# Patient Record
Sex: Female | Born: 1955 | Race: White | Hispanic: No | Marital: Single | Smoking: Never smoker
Health system: Southern US, Community
[De-identification: ages and names within clinical notes are randomized; demographics above are authoritative.]

## PROBLEM LIST (undated history)

## (undated) DIAGNOSIS — E079 Disorder of thyroid, unspecified: Secondary | ICD-10-CM

---

## 2017-02-07 ENCOUNTER — Emergency Department (HOSPITAL_BASED_OUTPATIENT_CLINIC_OR_DEPARTMENT_OTHER)

## 2017-02-07 ENCOUNTER — Emergency Department (HOSPITAL_BASED_OUTPATIENT_CLINIC_OR_DEPARTMENT_OTHER)
Admission: EM | Admit: 2017-02-07 | Discharge: 2017-02-07 | Disposition: A | Discharge status: Home / Self Care | Attending: Emergency Medicine | Admitting: Emergency Medicine

## 2017-02-07 ENCOUNTER — Encounter (HOSPITAL_BASED_OUTPATIENT_CLINIC_OR_DEPARTMENT_OTHER): Payer: Self-pay

## 2017-02-07 DIAGNOSIS — M25511 Pain in right shoulder: Secondary | ICD-10-CM

## 2017-02-07 HISTORY — DX: Disorder of thyroid, unspecified: E07.9

## 2017-02-07 NOTE — ED Triage Notes (Signed)
Pt fell while at work. C/o right shoulder pain. Pt in sling upon triage.

## 2017-02-07 NOTE — Discharge Instructions (Signed)
It was a pleasure to take care of you today.   No fracture or dislocation of right shoulder on Xray. In the meantime use sling on right arm for comfort. Attached are some shoulder exercises to complete as tolerated to avoid frozen shoulder. You may take ibuprofen or tylenol for pain. Apply ice to shoulder several times a day as well.   Please call tomorrow morning and schedule an appointment with orthopedic doctor for follow up of right shoulder pain and injury.   Please return to the Emergency Department for new or worsening symptoms.

## 2017-02-07 NOTE — ED Provider Notes (Signed)
[facility] Provider Note   CSN: [id number] Arrival date & time: 02/07/17  1230     History   Chief Complaint Chief Complaint  Patient presents with  . Shoulder Pain    HPI Linda Anthony is a 61 y.o. female.  HPI  Linda Anthony is a 61yo female with a history of Arthritis who presents to the emergency department via EMS for evaluation of right shoulder pain following a mechanical fall earlier this morning. 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 She states that she was turning and tripped over an object on the ground and and fell onto her right shoulder. She states that her right arm went behind her during the fall. She denies hitting her head, denies loss of consciousness. She states that her shoulder pain is "throbbing" and an 8/10 in severity. It is worsened when she lifts her arm above her head. She has not taken any medication for symptom relief as of yet. She denies headache, numbness, tingling, weakness. She has no previous history of right shoulder injury or surgery.    Past Medical History:  Diagnosis Date  . Thyroid disease     There are no active problems to display for this patient.   History reviewed. No pertinent surgical history.  OB History    No data available       Home Medications    Prior to Admission medications   Not on File    Family History No family history on file.  Social History Social History  Substance Use Topics  . Smoking status: Never Smoker  . Smokeless tobacco: Never Used  . Alcohol use No     Allergies   Patient has no known allergies.   Review of Systems Review of Systems  Musculoskeletal: Positive for arthralgias (right shoulder). Negative for back pain, gait problem, neck pain and neck stiffness.  Skin: Negative for rash and wound.  Neurological: Negative for weakness, numbness and headaches.     Physical Exam Updated Vital Signs BP 125/70 (BP Location: Left Arm)    Pulse 79   Temp 99.2 F (37.3 C) (Oral)   Resp 18   Ht  (1.626 m)   Wt 127 kg (280 lb)   SpO2 96%   BMI 48.06 kg/m   Physical Exam  Constitutional: She appears well-developed and well-nourished. No distress.  HENT:  Head: Normocephalic and atraumatic.  Eyes: Right eye exhibits no discharge. Left eye exhibits no discharge.  Pulmonary/Chest: Effort normal. No respiratory distress.  Musculoskeletal:  Right shoulder with tenderness to palpation over the head of the humerous. ROM limited by pain. She is unable to abduct the shoulder above her head due to pain. Negative empty can test. She is unable to completely flex her arm at the shoulder joint due to pain. No swelling, erythema or ecchymosis present. No step-off, crepitus, or deformity appreciated. 5/5 muscle strength of UE. 2+ radial pulse, sensation intact and all compartments soft.   Neurological: She is alert. No sensory deficit. Coordination normal.  Skin: Skin is warm and dry. She is not diaphoretic.  Psychiatric: She has a normal mood and affect. Her behavior is normal.  Nursing note and vitals reviewed.    ED Treatments / Results  Labs (all labs ordered are listed, but only abnormal results are displayed) Labs Reviewed - No data to display  EKG  EKG Interpretation None       Radiology Dg Shoulder Right  Result  Date: 02/07/2017 CLINICAL DATA:  Right shoulder pain after fall. EXAM: RIGHT SHOULDER - 2+ VIEW COMPARISON:  None. FINDINGS: There is no evidence of fracture or dislocation. Lucency through the inferior glenoid is likely an overlying skin fold. Degenerative changes of the acromioclavicular joint. Soft tissues are unremarkable. IMPRESSION: No acute osseous abnormality. Electronically Signed   By: Obie Dredge M.D.   On: 02/07/2017 13:10    Procedures Procedures (including critical care time)  Medications Ordered in ED Medications - No data to display   Initial Impression / Assessment and Plan / ED  Course  I have reviewed the triage vital signs and the nursing notes.  Pertinent labs & imaging results that were available during my care of the patient were reviewed by me and considered in my medical decision making (see chart for details).    Patient without acute fracture or dislocation of the right shoulder joint. PE shows no instability, tenderness, or deformity of acromioclavicular and sternoclavicular joints, glenohumeral joint, coracoid process, acromion, or scapula. Good shoulder strength during empty can test. Suspect impingement given difficulty and pain lifting her shoulder above her head. Will send her home in a sling and have her follow up with orthopedics. She can use NSAIDs and ice for pain in the meantime. Discussed return precautions and patient voices undnerstanding and agrees to discharge.      Final Clinical Impressions(s) / ED Diagnoses   Final diagnoses:  Acute pain of right shoulder    New Prescriptions New Prescriptions   No medications on file     Lawrence Marseilles 02/07/17 1624    Rolland Porter, MD 02/18/17 2033

## 2018-09-06 IMAGING — CR DG SHOULDER 2+V*R*
3 series · 3 of 3 positions shown · non-contrast
Comparison: None.

CLINICAL DATA: Right shoulder pain after fall.

EXAM:
RIGHT SHOULDER - 2+ VIEW

[w shoulder grashey right *]
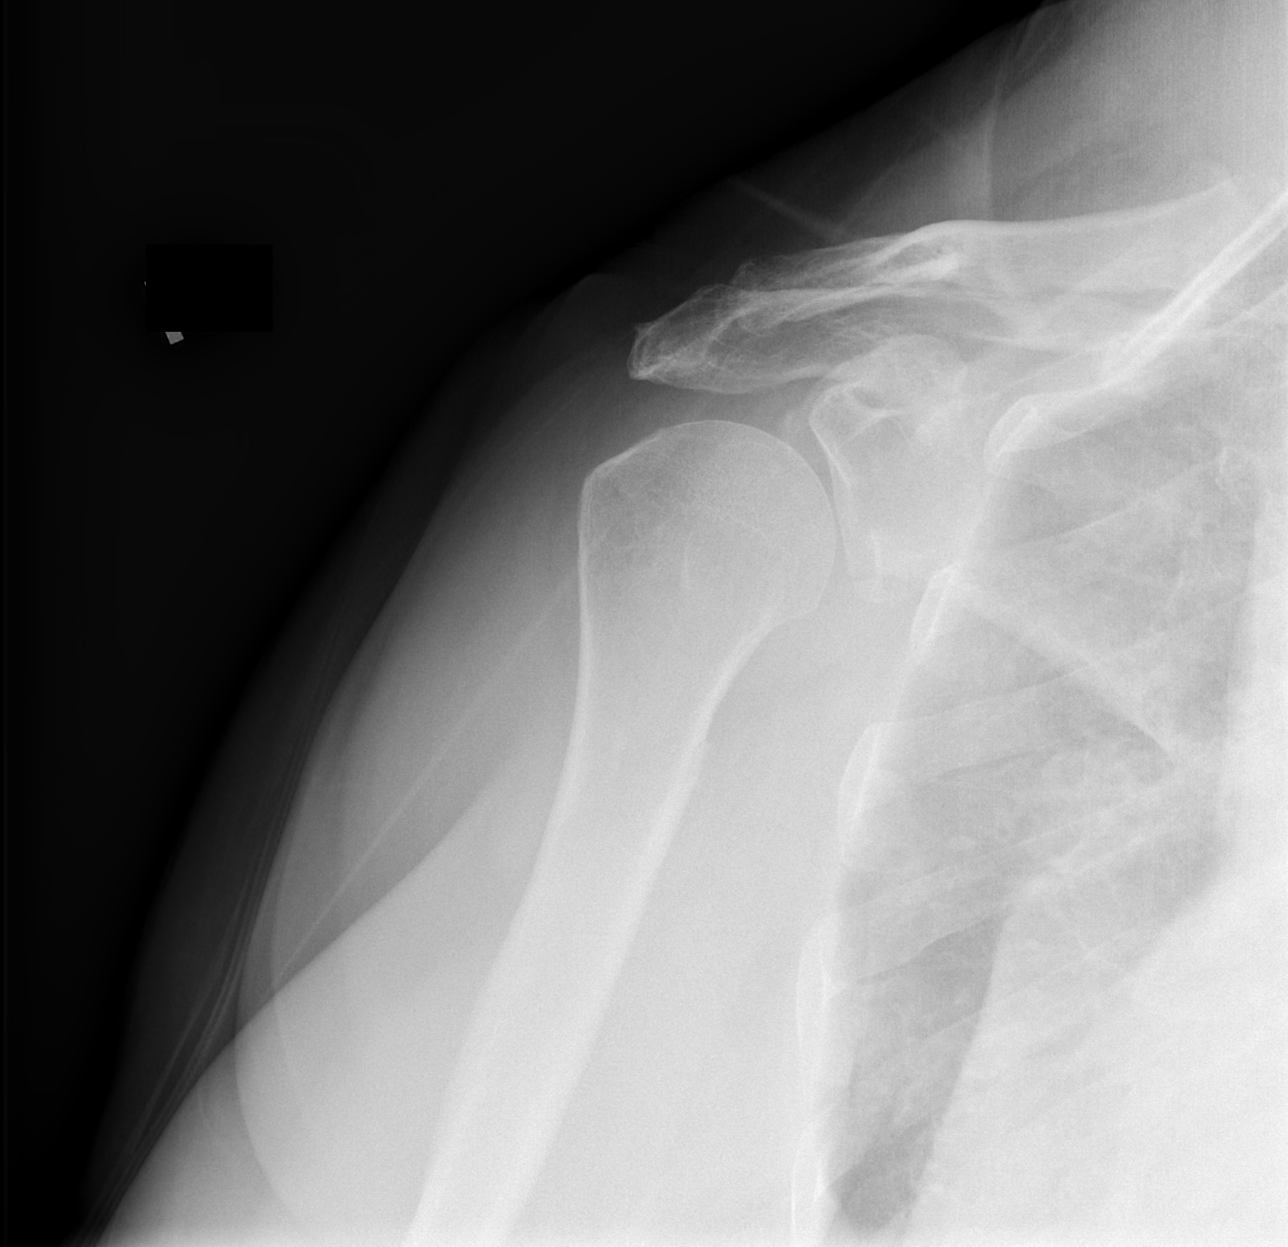

[w shoulder y view right *]
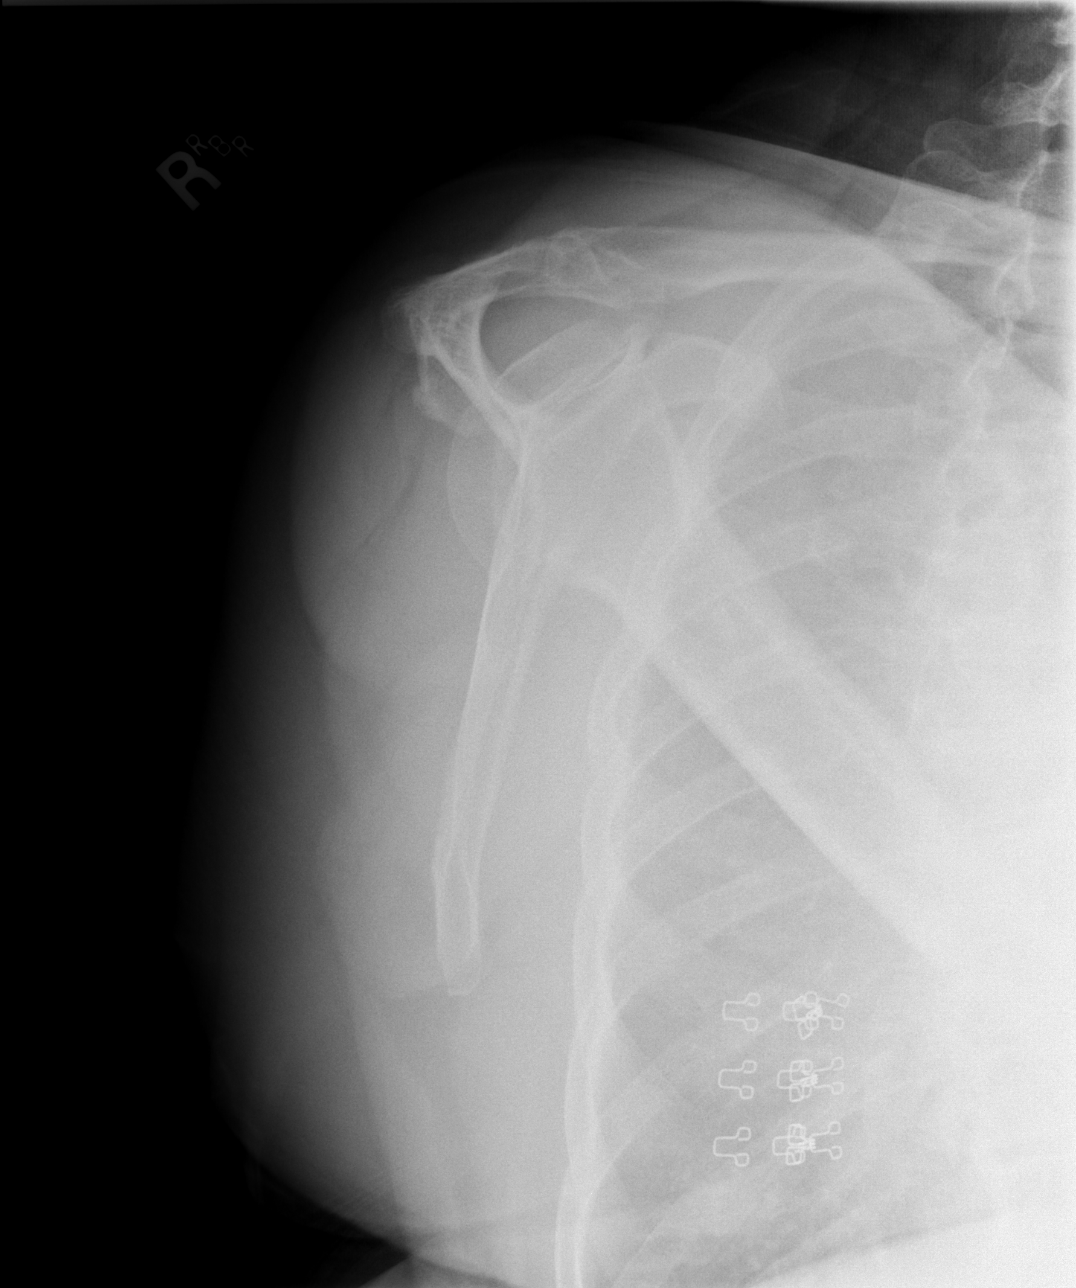

[x shoulder axillary right *]
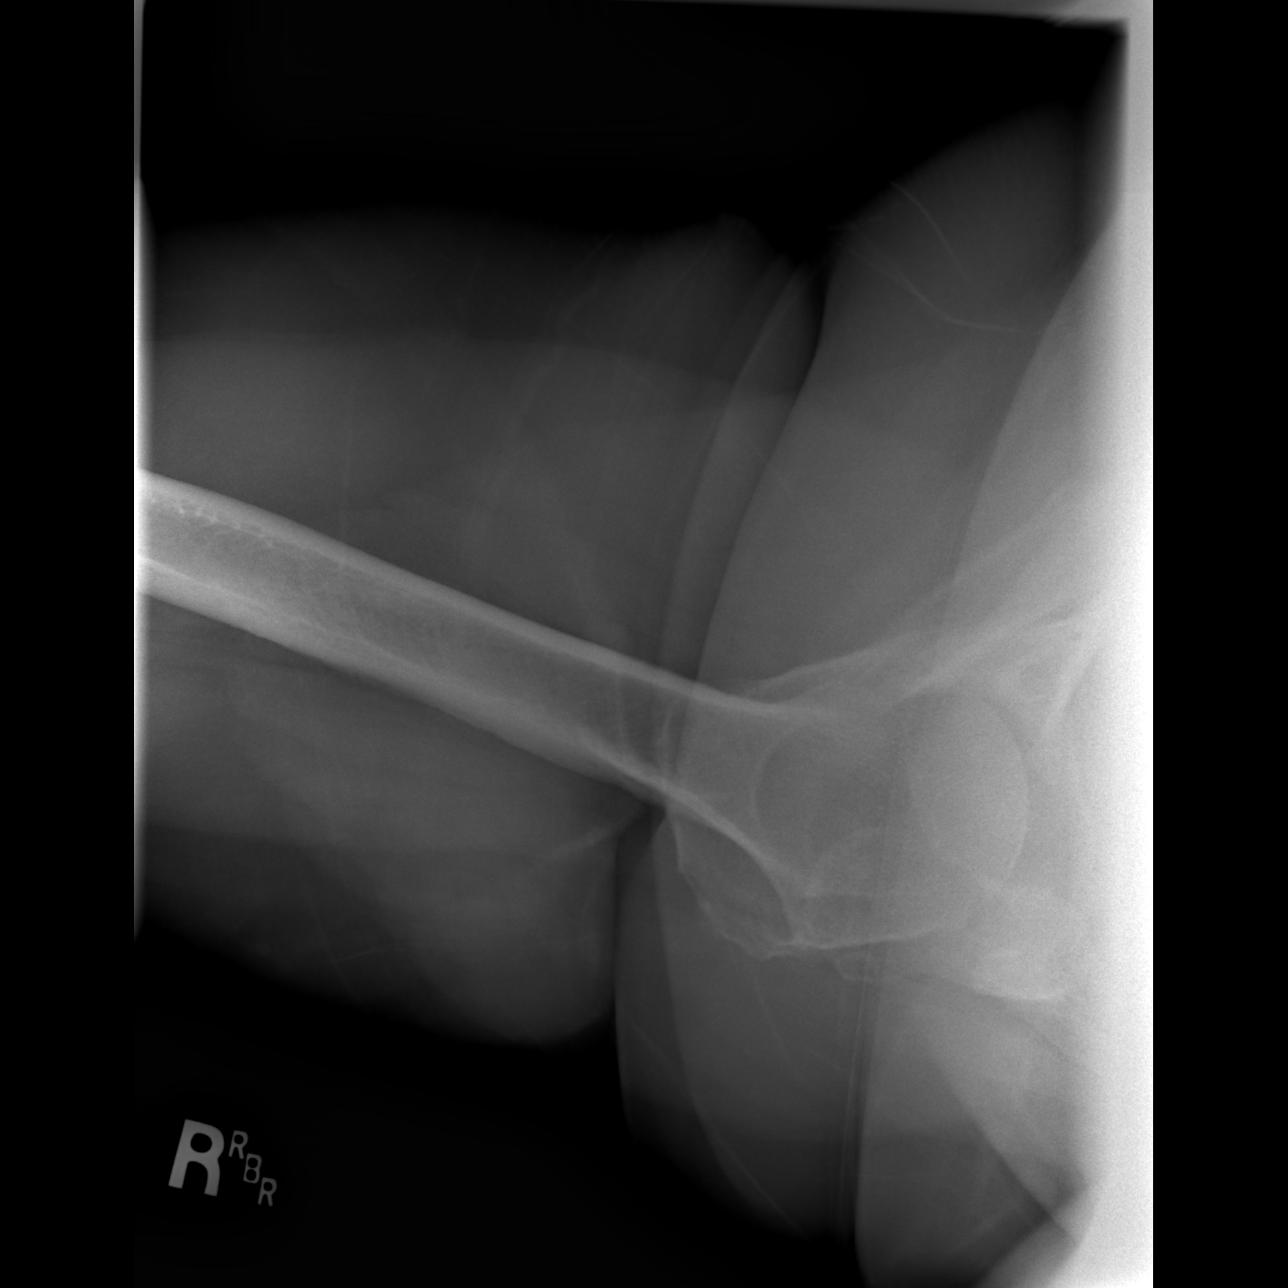

[3 of 3 positions shown; findings below may reference images not displayed]

FINDINGS: There is no evidence of fracture or dislocation. Lucency through the
inferior glenoid is likely an overlying skin fold. Degenerative
changes of the acromioclavicular joint. Soft tissues are
unremarkable.
IMPRESSION: No acute osseous abnormality.
